# Patient Record
Sex: Male | Born: 1960 | Race: White | Hispanic: No | Marital: Married | Smoking: Current some day smoker
Health system: Southern US, Community
[De-identification: ages and names within clinical notes are randomized; demographics above are authoritative.]

## PROBLEM LIST (undated history)

## (undated) DIAGNOSIS — I10 Essential (primary) hypertension: Secondary | ICD-10-CM

## (undated) DIAGNOSIS — I639 Cerebral infarction, unspecified: Secondary | ICD-10-CM

## (undated) DIAGNOSIS — E119 Type 2 diabetes mellitus without complications: Secondary | ICD-10-CM

## (undated) HISTORY — PX: VASECTOMY: SHX75

## (undated) HISTORY — PX: FINGER SURGERY: SHX640

## (undated) HISTORY — PX: TOE SURGERY: SHX1073

---

## 2004-12-05 ENCOUNTER — Emergency Department (HOSPITAL_COMMUNITY): Admission: EM | Admit: 2004-12-05 | Discharge: 2004-12-05 | Payer: Self-pay | Admitting: Emergency Medicine

## 2017-06-19 ENCOUNTER — Other Ambulatory Visit: Payer: Self-pay

## 2017-06-19 ENCOUNTER — Emergency Department: Payer: Self-pay

## 2017-06-19 ENCOUNTER — Encounter: Payer: Self-pay | Admitting: Emergency Medicine

## 2017-06-19 ENCOUNTER — Observation Stay: Payer: Self-pay

## 2017-06-19 ENCOUNTER — Observation Stay
Admission: EM | Admit: 2017-06-19 | Discharge: 2017-06-20 | Disposition: A | Discharge status: Home / Self Care | Payer: Self-pay | Attending: Internal Medicine | Admitting: Internal Medicine

## 2017-06-19 DIAGNOSIS — Z823 Family history of stroke: Secondary | ICD-10-CM | POA: Insufficient documentation

## 2017-06-19 DIAGNOSIS — Z7982 Long term (current) use of aspirin: Secondary | ICD-10-CM | POA: Insufficient documentation

## 2017-06-19 DIAGNOSIS — I10 Essential (primary) hypertension: Secondary | ICD-10-CM | POA: Insufficient documentation

## 2017-06-19 DIAGNOSIS — E119 Type 2 diabetes mellitus without complications: Secondary | ICD-10-CM | POA: Insufficient documentation

## 2017-06-19 DIAGNOSIS — N179 Acute kidney failure, unspecified: Secondary | ICD-10-CM | POA: Insufficient documentation

## 2017-06-19 DIAGNOSIS — F17290 Nicotine dependence, other tobacco product, uncomplicated: Secondary | ICD-10-CM | POA: Insufficient documentation

## 2017-06-19 DIAGNOSIS — E785 Hyperlipidemia, unspecified: Secondary | ICD-10-CM | POA: Insufficient documentation

## 2017-06-19 DIAGNOSIS — I672 Cerebral atherosclerosis: Secondary | ICD-10-CM | POA: Insufficient documentation

## 2017-06-19 DIAGNOSIS — G459 Transient cerebral ischemic attack, unspecified: Secondary | ICD-10-CM | POA: Diagnosis present

## 2017-06-19 DIAGNOSIS — I639 Cerebral infarction, unspecified: Principal | ICD-10-CM | POA: Insufficient documentation

## 2017-06-19 DIAGNOSIS — Z23 Encounter for immunization: Secondary | ICD-10-CM | POA: Insufficient documentation

## 2017-06-19 DIAGNOSIS — Z8673 Personal history of transient ischemic attack (TIA), and cerebral infarction without residual deficits: Secondary | ICD-10-CM | POA: Insufficient documentation

## 2017-06-19 DIAGNOSIS — Z7984 Long term (current) use of oral hypoglycemic drugs: Secondary | ICD-10-CM | POA: Insufficient documentation

## 2017-06-19 DIAGNOSIS — Z79899 Other long term (current) drug therapy: Secondary | ICD-10-CM | POA: Insufficient documentation

## 2017-06-19 HISTORY — DX: Essential (primary) hypertension: I10

## 2017-06-19 HISTORY — DX: Type 2 diabetes mellitus without complications: E11.9

## 2017-06-19 HISTORY — DX: Cerebral infarction, unspecified: I63.9

## 2017-06-19 LAB — COMPREHENSIVE METABOLIC PANEL
ALBUMIN: 3.7 g/dL (ref 3.5–5.0)
ALT: 30 U/L (ref 17–63)
AST: 30 U/L (ref 15–41)
Alkaline Phosphatase: 70 U/L (ref 38–126)
Anion gap: 13 (ref 5–15)
BUN: 34 mg/dL — ABNORMAL HIGH (ref 6–20)
CHLORIDE: 101 mmol/L (ref 101–111)
CO2: 24 mmol/L (ref 22–32)
CREATININE: 1.91 mg/dL — AB (ref 0.61–1.24)
Calcium: 9.1 mg/dL (ref 8.9–10.3)
GFR calc non Af Amer: 37 mL/min — ABNORMAL LOW (ref >60)
GFR, EST AFRICAN AMERICAN: 43 mL/min — AB (ref >60)
GLUCOSE: 89 mg/dL (ref 65–99)
Potassium: 3.3 mmol/L — ABNORMAL LOW (ref 3.5–5.1)
SODIUM: 138 mmol/L (ref 135–145)
Total Bilirubin: 0.8 mg/dL (ref 0.3–1.2)
Total Protein: 7.1 g/dL (ref 6.5–8.1)

## 2017-06-19 LAB — DIFFERENTIAL
BASOS PCT: 1 %
Basophils Absolute: 0 10*3/uL (ref 0–0.1)
Eosinophils Absolute: 0.2 10*3/uL (ref 0–0.7)
Eosinophils Relative: 2 %
Lymphocytes Relative: 23 %
Lymphs Abs: 2 10*3/uL (ref 1.0–3.6)
MONO ABS: 1 10*3/uL (ref 0.2–1.0)
Monocytes Relative: 11 %
NEUTROS ABS: 5.7 10*3/uL (ref 1.4–6.5)
NEUTROS PCT: 63 %

## 2017-06-19 LAB — CBC
HCT: 43 % (ref 40.0–52.0)
Hemoglobin: 15.1 g/dL (ref 13.0–18.0)
MCH: 31.2 pg (ref 26.0–34.0)
MCHC: 35 g/dL (ref 32.0–36.0)
MCV: 89.1 fL (ref 80.0–100.0)
PLATELETS: 235 10*3/uL (ref 150–440)
RBC: 4.82 MIL/uL (ref 4.40–5.90)
RDW: 13.5 % (ref 11.5–14.5)
WBC: 8.9 10*3/uL (ref 3.8–10.6)

## 2017-06-19 LAB — GLUCOSE, CAPILLARY
GLUCOSE-CAPILLARY: 84 mg/dL (ref 65–99)
Glucose-Capillary: 229 mg/dL — ABNORMAL HIGH (ref 65–99)

## 2017-06-19 LAB — TROPONIN I: Troponin I: <0.03 ng/mL (ref <0.03)

## 2017-06-19 LAB — PROTIME-INR
INR: 0.94
PROTHROMBIN TIME: 12.5 s (ref 11.4–15.2)

## 2017-06-19 LAB — APTT: APTT: 32 s (ref 24–36)

## 2017-06-19 MED ORDER — INSULIN ASPART 100 UNIT/ML ~~LOC~~ SOLN
0.0000 [IU] | Freq: Every day | SUBCUTANEOUS | Status: DC
  Administered 2017-06-19: 21:00:00 2 [IU] via SUBCUTANEOUS
  Filled 2017-06-19: qty 1

## 2017-06-19 MED ORDER — METFORMIN HCL 500 MG PO TABS
1000.0000 mg | ORAL_TABLET | Freq: Two times a day (BID) | ORAL | Status: DC
  Administered 2017-06-20: 09:00:00 1000 mg via ORAL
  Filled 2017-06-19 (×2): qty 2

## 2017-06-19 MED ORDER — ASPIRIN 81 MG PO CHEW
324.0000 mg | CHEWABLE_TABLET | Freq: Once | ORAL | Status: AC
  Administered 2017-06-19: 324 mg via ORAL
  Filled 2017-06-19: qty 4

## 2017-06-19 MED ORDER — STROKE: EARLY STAGES OF RECOVERY BOOK
Freq: Once | Status: AC
  Administered 2017-06-19: 21:00:00

## 2017-06-19 MED ORDER — ATORVASTATIN CALCIUM 20 MG PO TABS: 40.0000 mg | ORAL_TABLET | Freq: Every day | ORAL | Status: DC

## 2017-06-19 MED ORDER — GLIMEPIRIDE 4 MG PO TABS: 4.0000 mg | ORAL_TABLET | Freq: Every day | ORAL | Status: DC

## 2017-06-19 MED ORDER — LATANOPROST 0.005 % OP SOLN
1.0000 [drp] | Freq: Every day | OPHTHALMIC | Status: DC
  Administered 2017-06-19: 21:00:00 1 [drp] via OPHTHALMIC
  Filled 2017-06-19: qty 2.5

## 2017-06-19 MED ORDER — POTASSIUM CHLORIDE CRYS ER 20 MEQ PO TBCR
40.0000 meq | EXTENDED_RELEASE_TABLET | Freq: Once | ORAL | Status: AC
  Administered 2017-06-19: 40 meq via ORAL
  Filled 2017-06-19: qty 2

## 2017-06-19 MED ORDER — INSULIN ASPART 100 UNIT/ML ~~LOC~~ SOLN
0.0000 [IU] | Freq: Three times a day (TID) | SUBCUTANEOUS | Status: DC
  Administered 2017-06-20 (×2): 2 [IU] via SUBCUTANEOUS
  Filled 2017-06-19 (×2): qty 1

## 2017-06-19 MED ORDER — SODIUM CHLORIDE 0.9 % IV SOLN
INTRAVENOUS | Status: DC
  Administered 2017-06-19 – 2017-06-20 (×2): via INTRAVENOUS

## 2017-06-19 MED ORDER — GLIMEPIRIDE 1 MG PO TABS
1.0000 mg | ORAL_TABLET | Freq: Every day | ORAL | Status: DC
  Administered 2017-06-20: 09:00:00 1 mg via ORAL
  Filled 2017-06-19: qty 1

## 2017-06-19 MED ORDER — ASPIRIN EC 81 MG PO TBEC
81.0000 mg | DELAYED_RELEASE_TABLET | Freq: Every day | ORAL | Status: DC
  Administered 2017-06-20: 81 mg via ORAL
  Filled 2017-06-19: qty 1

## 2017-06-19 MED ORDER — INFLUENZA VAC SPLIT QUAD 0.5 ML IM SUSY
0.5000 mL | PREFILLED_SYRINGE | INTRAMUSCULAR | Status: AC
  Administered 2017-06-20: 09:00:00 0.5 mL via INTRAMUSCULAR
  Filled 2017-06-19: qty 0.5

## 2017-06-19 NOTE — ED Notes (Signed)
Telestroke MD ended session, Stated she scored patient a 0 on the NIHSS stroke scale

## 2017-06-19 NOTE — H&P (Signed)
[facility] [place] [facility]   PATIENT NAME: Julian Cameron    MR#:  [id number]  DATE OF BIRTH:  08/05/1960  DATE OF ADMISSION:  06/19/2017  PRIMARY CARE PHYSICIAN: Dr Suzy Boucharduc Tru  REQUESTING/REFERRING PHYSICIAN: Dr Ileana RoupJames McShane  CHIEF COMPLAINT:   Chief Complaint  Patient presents with  . Numbness    HISTORY OF PRESENT ILLNESS:  Julian Cameron  is a 56 y.o. male is here at his son's wedding.  [place].  He has a history of prior stroke 8 years ago hypertension and diabetes.  Today around 230 he got to the wedding reception and his right arm and hand was numb.  His speech would not work correctly.  He was mumbling.  He knew what he wanted to say but just could not say it.  He came here 15 minutes afterwards.  His speech and arm symptoms have normalized since.  He was seen by telemetry neurology and they recommended further testing here in the hospital.  PAST MEDICAL HISTORY:   Past Medical History:  Diagnosis Date  . Diabetes mellitus without complication (HCC)   . Hypertension   . Stroke [facility](HCC)     PAST SURGICAL HISTORY:   Past Surgical History:  Procedure Laterality Date  . FINGER SURGERY    . TOE SURGERY    . VASECTOMY      SOCIAL HISTORY:   Social History   Tobacco Use  . Smoking status: Current Some Day Smoker    Types: Cigars  . Smokeless tobacco: Never Used  Substance Use Topics  . Alcohol use: No    Frequency: Never    FAMILY HISTORY:   Family History  Problem Relation Age of Onset  . CVA Mother   . Bladder Cancer Father     DRUG ALLERGIES:  No Known Allergies  REVIEW OF SYSTEMS:  CONSTITUTIONAL: No fever, fatigue or weakness.  EYES: No blurred or double vision.  Wears glasses. EARS, NOSE, AND THROAT: No tinnitus or ear pain. No sore throat RESPIRATORY: No cough, shortness of breath, wheezing or hemoptysis.  CARDIOVASCULAR: No chest pain, orthopnea, edema.  GASTROINTESTINAL: No nausea, vomiting,  diarrhea or abdominal pain. No blood in bowel movements GENITOURINARY: No dysuria, hematuria.  ENDOCRINE: No polyuria, nocturia,  HEMATOLOGY: No anemia, easy bruising or bleeding SKIN: No rash or lesion. MUSCULOSKELETAL: No joint pain or arthritis.  Left shoulder bursitis NEUROLOGIC: No tingling, numbness, weakness.  PSYCHIATRY: No anxiety or depression.   MEDICATIONS AT HOME:   Prior to Admission medications   Medication Sig Start Date End Date Taking? Authorizing Provider  cloNIDine (CATAPRES) 0.2 MG tablet Take 1 tablet 2 (two) times daily by mouth. 06/17/17   [provider]  glimepiride (AMARYL) 4 MG tablet Take 1 tablet daily by mouth. 03/22/17   [provider]  hydrochlorothiazide (HYDRODIURIL) 25 MG tablet Take 1 tablet daily by mouth. 03/22/17   [provider]  latanoprost (XALATAN) 0.005 % ophthalmic solution Place 1 drop into both eyes. 04/24/17   [provider]  metFORMIN (GLUCOPHAGE) 1000 MG tablet Take 1 tablet 2 (two) times daily by mouth. 03/22/17   [provider]  simvastatin (ZOCOR) 10 MG tablet Take 1 tablet daily by mouth. 03/22/17   [provider]   Medication reconciliation still undergoing  VITAL SIGNS:  Blood pressure 132/79, pulse (!) 54, temperature 97.8 F (36.6 C), resp. rate (!) 21, height 5\' 8"  (1.727 m), weight 97.5 kg (215 lb), SpO2 96 %.  PHYSICAL EXAMINATION:  GENERAL:  56 y.o.-year-old patient lying in the bed with no acute distress.  EYES: Pupils equal, round, reactive to light and accommodation. No scleral icterus. Extraocular muscles intact.  HEENT: Head atraumatic, normocephalic. Oropharynx and nasopharynx clear.  NECK:  Supple, no jugular venous distention. No thyroid enlargement, no tenderness.  LUNGS: Normal breath sounds bilaterally, no wheezing, rales,rhonchi or crepitation. No use of accessory muscles of respiration.  CARDIOVASCULAR: S1, S2 normal. No murmurs, rubs, or gallops.   ABDOMEN: Soft, nontender, nondistended. Bowel sounds present. No organomegaly or mass.  EXTREMITIES: No pedal edema, cyanosis, or clubbing.  NEUROLOGIC: Cranial nerves II through XII are intact. Muscle strength 5/5 in all extremities. Sensation intact to light touch.. Gait not checked.  Babinski negative bilaterally PSYCHIATRIC: The patient is alert and oriented x 3.  SKIN: No rash, lesion, or ulcer.   LABORATORY PANEL:   CBC Recent Labs  Lab 06/19/17 1536  WBC 8.9  HGB 15.1  HCT 43.0  PLT 235   ------------------------------------------------------------------------------------------------------------------  Chemistries  Recent Labs  Lab 06/19/17 1536  NA 138  K 3.3*  CL 101  CO2 24  GLUCOSE 89  BUN 34*  CREATININE 1.91*  CALCIUM 9.1  AST 30  ALT 30  ALKPHOS 70  BILITOT 0.8   ------------------------------------------------------------------------------------------------------------------  Cardiac Enzymes Recent Labs  Lab 06/19/17 1536  TROPONINI <0.03   ------------------------------------------------------------------------------------------------------------------  RADIOLOGY:  Ct Head Code Stroke Wo Contrast  Result Date: 06/19/2017 CLINICAL DATA:  Code stroke. New onset right-sided numbness and slurred speech beginning 30 minutes ago. EXAM: CT HEAD WITHOUT CONTRAST TECHNIQUE: Contiguous axial images were obtained from the base of the skull through the vertex without intravenous contrast. COMPARISON:  None. FINDINGS: Brain: No acute infarct, hemorrhage, or mass lesion is present. The ventricles are of normal size. No significant extraaxial fluid collection is present. No significant white matter disease is present. The brainstem and cerebellum are normal. Vascular: Atherosclerotic calcifications are present within the cavernous internal carotid arteries bilaterally and at the dural margin of both vertebral arteries. There is no hyperdense vessel. Skull: The  calvarium is intact. No focal lytic or blastic lesions are present. Sinuses/Orbits: The paranasal sinuses and mastoid air cells are clear. Globes and orbits are within normal limits. ASPECTS [facility] Stroke Program Early CT Score) - Ganglionic level infarction (caudate, lentiform nuclei, internal capsule, insula, M1-M3 cortex): 7/7 - Supraganglionic infarction (M4-M6 cortex): 3/3 Total score (0-10 with 10 being normal): 10/10 IMPRESSION: 1. Normal CT appearance of the brain. 2. Prominent atherosclerotic calcifications of the cavernous internal carotid arteries and vertebral arteries bilaterally without a hyperdense vessel. This is advanced for age. 3. ASPECTS is 10/10 These results were called by telephone at the time of interpretation on 06/19/2017 at 3:36 pm to Dr. Ileana Roup , who verbally acknowledged these results. Electronically Signed   By: Marin Roberts M.D.   On: 06/19/2017 15:37    EKG:   Sinus rhythm 57 bpm.  IMPRESSION AND PLAN:   1.  Numbness and tingling right arm with slurred speech.  Suspected transient ischemic attack.  Observe overnight on telemetry.  MRI of the brain, carotid ultrasound and echocardiogram ordered.  Aspirin, Lipitor prescribed. 2.  Acute kidney injury versus chronic kidney disease.  Unknown creatinine baseline.  IV fluids overnight and check a creatinine in the morning. 3.  Type 2 diabetes mellitus.  Check a hemoglobin A1c.  Lower the dose of glimepiride to 1 mg daily.  Continue Glucophage.  Sliding scale. 4.  Essential hypertension.  Allow permissive hypertension and hold antihypertensive medications.  If the patient does take clonidine his blood pressure may elevate rather quickly without a dose tonight. 5.  Hyperlipidemia unspecified check a lipid profile in the morning and add Lipitor. 6.  Hypokalemia replace potassium orally  All the records are reviewed and case discussed with ED provider. Management plans discussed with the patient, family and they  are in agreement.  CODE STATUS: Full code but he will speak with his wife about this  TOTAL TIME TAKING CARE OF THIS PATIENT: 50 minutes.    Alford Highlandichard Saajan Willmon M.D on 06/19/2017 at 6:07 PM  Between 7am to 6pm - Pager - [phone]  After 6pm call admission pager [phone]  [facility] Office  [phone]  CC: Primary care physician; Patient, No Pcp Per

## 2017-06-19 NOTE — Progress Notes (Signed)
[facility] responded to code Stemi. Patient was being transported for a CT scan. Family members were present in the room and they were informed about patient status. Family will contact [facility] when they are ready.

## 2017-06-19 NOTE — ED Provider Notes (Addendum)
[facility] Emergency Department Provider Note  ____________________________________________   I have reviewed the triage vital signs and the nursing notes.   HISTORY  Chief Complaint Numbness    HPI Julian Cameron is a 56 y.o. male a history of prior stroke 8 or 9 years ago, history of hypertension, states he did not have residual weakness or numbness from his prior stroke, states that he has been somewhat sleepy "all day" but no focal neurologic deficits.  He states about 3:00 or 2:50 he began having acute difficulty understanding and speaking as well as right-sided tingling.  He was under stress because he was at his son's wedding.  He states his symptoms are rapidly gone away.  At this time he denies any tingling to me and he states that his speech is back to normal.  Family agrees with this.  Symptoms lasted for approximately 20-30 minutes possibly at the most.  Nothing made them better except for time nothing made it worse, no other alleviating or aggravating symptoms no other complaints.  No headache, no chest pain or shortness of breath no nausea no vomiting no fever or chills not drinking alcohol  Past Medical History:  Diagnosis Date  . Hypertension   . Stroke [facility](HCC)     There are no active problems to display for this patient.   Past Surgical History:  Procedure Laterality Date  . FINGER SURGERY    . TOE SURGERY      Prior to Admission medications   Not on File    Allergies Patient has no known allergies.  No family history on file.  Social History Social History   Tobacco Use  . Smoking status: Current Some Day Smoker    Types: Cigars  . Smokeless tobacco: Never Used  Substance Use Topics  . Alcohol use: No    Frequency: Never  . Drug use: No    Review of Systems Constitutional: No fever/chills Eyes: No visual changes. ENT: No sore throat. No stiff neck no neck pain Cardiovascular: Denies chest pain. Respiratory: Denies  shortness of breath. Gastrointestinal:   no vomiting.  No diarrhea.  No constipation. Genitourinary: Negative for dysuria. Musculoskeletal: Negative lower extremity swelling Skin: Negative for rash. Neurological: Negative for severe headaches, f HPI   ____________________________________________   PHYSICAL EXAM:  VITAL SIGNS: ED Triage Vitals [06/19/17 1517]  Enc Vitals Group     BP (!) 143/91     Pulse Rate 61     Resp 16     Temp 97.6 F (36.4 C)     Temp Source Oral     SpO2 99 %     Weight      Height      Head Circumference      Peak Flow      Pain Score      Pain Loc      Pain Edu?      Excl. in GC?     Constitutional: Alert and oriented. Well appearing and in no acute distress. Eyes: Conjunctivae are normal Head: Atraumatic HEENT: No congestion/rhinnorhea. Mucous membranes are moist.  Oropharynx non-erythematous Neck:   Nontender with no meningismus, no masses, no stridor Cardiovascular: Normal rate, regular rhythm. Grossly normal heart sounds.  Good peripheral circulation. Respiratory: Normal respiratory effort.  No retractions. Lungs CTAB. Abdominal: Soft and nontender. No distention. No guarding no rebound Back:  There is no focal tenderness or step off.  there is no midline tenderness there are no lesions noted.  there is no CVA tenderness Musculoskeletal: No lower extremity tenderness, no upper extremity tenderness. No joint effusions, no DVT signs strong distal pulses no edema Neurologic: Cranial nerves II through XII are grossly intact 5 out of 5 strength bilateral upper and lower extremity. Finger to nose within normal limits heel to shin within normal limits, speech is normal with no word finding difficulty or dysarthria, reflexes symmetric, pupils are equally round and reactive to light, there is no pronator drift, sensation is normal, vision is intact to confrontation, gait is deferred, there is no nystagmus, normal neurologic exam Skin:  Skin is warm, dry  and intact. No rash noted. Psychiatric: Mood and affect are anxious. Speech and behavior are normal.  ____________________________________________   LABS (all labs ordered are listed, but only abnormal results are displayed)  Labs Reviewed  PROTIME-INR  APTT  CBC  DIFFERENTIAL  COMPREHENSIVE METABOLIC PANEL  TROPONIN I  CBG MONITORING, ED    Pertinent labs  results that were available during my care of the patient were reviewed by me and considered in my medical decision making (see chart for details). ____________________________________________  EKG  I personally interpreted any EKGs ordered by me or triage Sinus rhythm at 57 bpm, bradycardia noted, no acute ST elevation or depression, normal axis ____________________________________________  RADIOLOGY  Pertinent labs & imaging results that were available during my care of the patient were reviewed by me and considered in my medical decision making (see chart for details). If possible, patient and/or family made aware of any abnormal findings.  No results found. ____________________________________________    PROCEDURES  Procedure(s) performed: None  Procedures  Critical Care performed: None  ____________________________________________   INITIAL IMPRESSION / ASSESSMENT AND PLAN / ED COURSE  Pertinent labs & imaging results that were available during my care of the patient were reviewed by me and considered in my medical decision making (see chart for details).  Patient here with acute onset stroke symptoms, CT is read as negative by radiology, tele-psychiatry has been consulted, patient's blood work is going back blood sugar is normal.  NIH stroke scale is currently 0.  Awaiting neurologic input from neurology  ----------------------------------------- 4:20 PM on 06/19/2017 ----------------------------------------- Neurology agrees with mgt and no tpa.  They want asa and admission for w/u.         ____________________________________________   FINAL CLINICAL IMPRESSION(S) / ED DIAGNOSES  Final diagnoses:  None      This chart was dictated using voice recognition software.  Despite best efforts to proofread,  errors can occur which can change meaning.      Jeanmarie PlantMcShane, Syesha Thaw A, MD 06/19/17 1540    Jeanmarie PlantMcShane, Siniyah Evangelist A, MD 06/19/17 [phone]

## 2017-06-19 NOTE — ED Notes (Signed)
Called dietary and ordered

## 2017-06-19 NOTE — ED Notes (Signed)
Telestroke MD on Robot

## 2017-06-19 NOTE — ED Notes (Signed)
Called report to Andres Shadedra Malcolm RN.

## 2017-06-19 NOTE — ED Notes (Signed)
Patient transported to MRI 

## 2017-06-19 NOTE — ED Notes (Signed)
Telestroke MD notified

## 2017-06-19 NOTE — Consult Note (Signed)
   [facility] TeleNeurology Consult Services  Impression: TIA vs Stroke  Given focal symptoms and short duration, could be either tia or stroke. He currently reports feeling well and close to his baseline.  Less likely related to HTN or recrudescence of prior stroke.   Not a tpa candidate due to: NIHSS 0, rapidly improving symptoms Not an NIR candidate due to: NIHSS 0, rapidly improving symptoms  Comments:   [facility] contacted: 1631 [facility] at bedside: 1641 NIHSS assessment time: 1641  Recommendations:   Antiplatelet therapy with Aspirin 325 mg now Stroke protocol w/u Tele PT OT ST evaluations  dvt ppx Further inpatient evaluation as per Neurology/ Internal Medicine Discussed with ED MD  History of Present Illness   Patient is a 56 yo M with h/o prior stroke (no residual deficits) HTN P/w acute right hand numbness and word finding difficulty The patient had been feeling unwell all day but had gone to a wedding with family .  He noted while traveling to the reception that his right hand became numb/ tingly.  He went to tell his family but found it difficult to describe the situation to them. They did not recognize any speech trouble from him at the time but he subjectively felt trouble finding his words.  This presentation is similar to the presentation he had with his stroke 8 years ago.  HCT is negative. He does not take asa or blood thinners.  Symptoms lasted about 30 min per patient.    Exam  BP 132/79   Pulse (!) 54   Temp 97.8 F (36.6 C)   Resp (!) 21   Ht 5\' 8"  (1.727 m)   Wt 97.5 kg (215 lb)   SpO2 96%   BMI 32.69 kg/m   NIHSS score:0 1A: Level of Consciousness - Alert; keenly responsive 1B: Ask Month and Age - Both Questions Right 1C: 'Blink Eyes' & 'Squeeze Hands' - Performs Both Tasks 2: Test Horizontal Extraocular Movements - Normal 3: Test Visual Fields - No Visual Loss 4: Test Facial Palsy - Normal symmetry 5A: Test Left Arm  Motor Drift - No Drift for 10 Seconds 5B: Test Right Arm Motor Drift - No Drift for 10 Seconds 6A: Test Left Leg Motor Drift - No Drift for 5 Seconds 6B: Test Right Leg Motor Drift - No Drift for 5 Seconds 7: Test Limb Ataxia - No Ataxia 8: Test Sensation - Normal; No sensory loss 9: Test Language/Aphasia - Normal; No aphasia 10: Test Dysarthria - Normal 11: Test Extinction/Inattention - No abnormality  Medical Decision Making:  - Extensive number of diagnosis or management options are considered above.   - Extensive amount of complex data reviewed.   - High risk of complication and/or morbidity or mortality are associated with differential diagnostic considerations above.  - There may be Uncertain outcome and increased probability of prolonged functional impairment or high probability of severe prolonged functional impairment associated with some of these differential diagnosis.  Medical Data Reviewed:  1.Data reviewed include clinical labs, radiology,  Medical Tests;   2.Tests results discussed w/performing or interpreting physician;   3.Obtaining/reviewing old medical records;  4.Obtaining case history from another source;  5.Independent review of image, tracing or specimen.

## 2017-06-19 NOTE — ED Triage Notes (Signed)
Pt states that he was at his sons wedding and developed numbness in the right side and slurred speech. Pt states that numbness has subsided some but is still present, no slurred speech noted at this time. No obvious facial drop noted.

## 2017-06-19 NOTE — ED Notes (Signed)
Stroke robot rolled into room

## 2017-06-19 NOTE — ED Notes (Signed)
Stroke robot activated

## 2017-06-20 ENCOUNTER — Observation Stay
Admit: 2017-06-20 | Discharge: 2017-06-20 | Disposition: A | Discharge status: Home / Self Care | Payer: Self-pay | Attending: Internal Medicine | Admitting: Internal Medicine

## 2017-06-20 ENCOUNTER — Observation Stay: Payer: Self-pay

## 2017-06-20 LAB — GLUCOSE, CAPILLARY
Glucose-Capillary: 180 mg/dL — ABNORMAL HIGH (ref 65–99)
Glucose-Capillary: 195 mg/dL — ABNORMAL HIGH (ref 65–99)

## 2017-06-20 LAB — LIPID PANEL
CHOL/HDL RATIO: 6.7 ratio
Cholesterol: 182 mg/dL (ref 0–200)
HDL: 27 mg/dL — AB (ref >40)
LDL CALC: UNDETERMINED mg/dL (ref 0–99)
TRIGLYCERIDES: 426 mg/dL — AB (ref <150)
VLDL: UNDETERMINED mg/dL (ref 0–40)

## 2017-06-20 LAB — CBC
HEMATOCRIT: 42.8 % (ref 40.0–52.0)
Hemoglobin: 14.9 g/dL (ref 13.0–18.0)
MCH: 31.2 pg (ref 26.0–34.0)
MCHC: 34.7 g/dL (ref 32.0–36.0)
MCV: 89.7 fL (ref 80.0–100.0)
Platelets: 208 10*3/uL (ref 150–440)
RBC: 4.77 MIL/uL (ref 4.40–5.90)
RDW: 13.9 % (ref 11.5–14.5)
WBC: 7.4 10*3/uL (ref 3.8–10.6)

## 2017-06-20 LAB — BASIC METABOLIC PANEL
Anion gap: 8 (ref 5–15)
BUN: 31 mg/dL — ABNORMAL HIGH (ref 6–20)
CO2: 25 mmol/L (ref 22–32)
CREATININE: 1.55 mg/dL — AB (ref 0.61–1.24)
Calcium: 8.8 mg/dL — ABNORMAL LOW (ref 8.9–10.3)
Chloride: 103 mmol/L (ref 101–111)
GFR calc Af Amer: 56 mL/min — ABNORMAL LOW (ref >60)
GFR calc non Af Amer: 48 mL/min — ABNORMAL LOW (ref >60)
GLUCOSE: 220 mg/dL — AB (ref 65–99)
Potassium: 4.1 mmol/L (ref 3.5–5.1)
Sodium: 136 mmol/L (ref 135–145)

## 2017-06-20 LAB — HEMOGLOBIN A1C
Hgb A1c MFr Bld: 9.1 % — ABNORMAL HIGH (ref 4.8–5.6)
Mean Plasma Glucose: 214.47 mg/dL

## 2017-06-20 LAB — ECHOCARDIOGRAM COMPLETE
Height: 68 in
Weight: 3440 oz

## 2017-06-20 MED ORDER — NICOTINE 21 MG/24HR TD PT24: 21.0000 mg | MEDICATED_PATCH | Freq: Every day | TRANSDERMAL | 0 refills | Status: AC

## 2017-06-20 MED ORDER — ATORVASTATIN CALCIUM 40 MG PO TABS: 40.0000 mg | ORAL_TABLET | Freq: Every day | ORAL | 0 refills | Status: AC

## 2017-06-20 MED ORDER — SODIUM CHLORIDE 0.9% FLUSH
3.0000 mL | Freq: Two times a day (BID) | INTRAVENOUS | Status: DC
  Administered 2017-06-20: 09:00:00 3 mL via INTRAVENOUS

## 2017-06-20 MED ORDER — ASPIRIN 81 MG PO TBEC: 81.0000 mg | DELAYED_RELEASE_TABLET | Freq: Every day | ORAL | Status: AC

## 2017-06-20 NOTE — Plan of Care (Signed)
Stroke workup complete with NIH-0.  Reports he has returned to baseline; family at bedside at long intervals. Stroke education complete.

## 2017-06-20 NOTE — ED Notes (Signed)
Patient in CT

## 2017-06-20 NOTE — Discharge Summary (Signed)
[facility] [place] at [facility]    Julian Cameron was admitted to the Hospital on 06/19/2017 and Discharged  06/20/2017 and should be excused from work/school   for 4  days starting 06/19/2017 , may return to work/school without any restrictions.  Call Auburn BilberryShreyang Elfida Shimada MD with questions.  Auburn BilberryPATEL, Yang Rack M.D on 06/20/2017,at 12:16 PM  [facility] [place] [facility]    Office  [phone]

## 2017-06-20 NOTE — Progress Notes (Signed)
Physical Therapy Evaluation Patient Details Name: Julian Cameron MRN: [id number] DOB: [birth year] Today's Date: 06/20/2017   History of Present Illness  Patient is a 56 y.o. male admitted on 17 NOV after noticing R UE numbness and slurred speech at son's wedding reception. PMH includes CVA 8 years ago, HTN, DMII, HLD, and hypokalemia. Found to have two small white matter infarcts in the bifrontal white matter.  Clinical Impression  Patient admitted for above listed reasons. Patient in [place] for son's wedding from [place] with intermittent onset of R UE numbness and slurred speech. At time of PT evaluation, sensation/strength WNL. Patient demonstrates independence in all aspects of mobility and will not require PT f/u upon d/c.    Follow Up Recommendations No PT follow up    Equipment Recommendations  None recommended by PT    Recommendations for Other Services       Precautions / Restrictions Precautions Precautions: None Restrictions Weight Bearing Restrictions: No      Mobility  Bed Mobility Overal bed mobility: Independent             General bed mobility comments: Patient performs bed mobility independently.  Transfers Overall transfer level: Independent Equipment used: None             General transfer comment: Patient moves from sit to stand and stand to sit with good safety awareness.  Ambulation/Gait Ambulation/Gait assistance: Independent Ambulation Distance (Feet): 220 Feet Assistive device: None       General Gait Details: Patient ambulates at safe community gait speed with no notable deviations.  Stairs            Wheelchair Mobility    Modified Rankin (Stroke Patients Only)       Balance Overall balance assessment: Independent                                           Pertinent Vitals/Pain Pain Assessment: No/denies pain    Home Living Family/patient expects to be discharged to:: Private residence Living  Arrangements: Spouse/significant other Available Help at Discharge: Family;Available PRN/intermittently Type of Home: House Home Access: Stairs to enter   Entrance Stairs-Number of Steps: 5 Home Layout: Two level;Able to live on main level with bedroom/bathroom Home Equipment: None      Prior Function Level of Independence: Independent         Comments: Patient from [place], working as a [employer] architect[employer]ruck driver.     Hand Dominance        Extremity/Trunk Assessment   Upper Extremity Assessment Upper Extremity Assessment: Overall WFL for tasks assessed    Lower Extremity Assessment Lower Extremity Assessment: Overall WFL for tasks assessed       Communication   Communication: No difficulties  Cognition Arousal/Alertness: Awake/alert Behavior During Therapy: WFL for tasks assessed/performed Overall Cognitive Status: Within Functional Limits for tasks assessed                                        General Comments      Exercises     Assessment/Plan    PT Assessment Patent does not need any further PT services  PT Problem List         PT Treatment Interventions      PT Goals (Current goals can be found in  the Care Plan section)  Acute Rehab PT Goals Patient Stated Goal: To go home PT Goal Formulation: With patient/family Time For Goal Achievement: 07/04/17 Potential to Achieve Goals: Good    Frequency     Barriers to discharge        Co-evaluation               AM-PAC PT "6 Clicks" Daily Activity  Outcome Measure Difficulty turning over in bed (including adjusting bedclothes, sheets and blankets)?: None Difficulty moving from lying on back to sitting on the side of the bed? : None Difficulty sitting down on and standing up from a chair with arms (e.g., wheelchair, bedside commode, etc,.)?: None Help needed moving to and from a bed to chair (including a wheelchair)?: None Help needed walking in hospital room?: None Help needed climbing  3-5 steps with a railing? : None 6 Click Score: 24    End of Session   Activity Tolerance: Patient tolerated treatment well Patient left: in bed;with call bell/phone within reach;with family/visitor present   PT Visit Diagnosis: Other symptoms and signs involving the nervous system (R29.898)    Time: 1203-1219 PT Time Calculation (min) (ACUTE ONLY): 16 min   Charges:   PT Evaluation $PT Eval Low Complexity: 1 Low     PT G Codes:   PT G-Codes **NOT FOR INPATIENT CLASS** Functional Assessment Tool Used: AM-PAC 6 Clicks Basic Mobility;Clinical judgement Functional Limitation: Mobility: Walking and moving around Mobility: Walking and Moving Around Current Status ([id number]): At least 1 percent but less than 20 percent impaired, limited or restricted Mobility: Walking and Moving Around Goal Status [phone](G8979): At least 1 percent but less than 20 percent impaired, limited or restricted Mobility: Walking and Moving Around Discharge Status [phone](G8980): At least 1 percent but less than 20 percent impaired, limited or restricted      Julian Cameron, PT, DPT, COMT 06/20/2017, 12:48 PM

## 2017-06-20 NOTE — Progress Notes (Signed)
Oral and written AVS instructions given with prescriptions with stated understanding. States ready to go home and feels well/baseline.

## 2017-06-20 NOTE — Progress Notes (Signed)
*  PRELIMINARY RESULTS* Echocardiogram 2D Echocardiogram has been performed.  Julian Cameron 06/20/2017, 9:17 AM

## 2017-06-20 NOTE — ED Notes (Signed)
Stroke team at bedside upon Code Stroke being called

## 2017-06-20 NOTE — Discharge Instructions (Addendum)
Ischemic Stroke An ischemic stroke is the sudden death of brain tissue. Blood carries oxygen to all areas of the body. This type of stroke happens when your blood does not flow to your brain like normal. Your brain cannot get the oxygen it needs. This is an emergency. It must be treated right away. Symptoms of a stroke usually happen all of a sudden. You may notice them when you wake up. They can include:  Weakness or loss of feeling in your face, arm, or leg. This often happens on one side of the body.  Trouble walking.  Trouble moving your arms or legs.  Loss of balance or coordination.  Feeling confused.  Trouble talking or understanding what people are saying.  Slurred speech.  Trouble seeing.  Seeing two of one object (double vision).  Feeling dizzy.  Feeling sick to your stomach (nauseous) and throwing up (vomiting).  A very bad headache for no reason.  Get help as soon as any of these problems start. This is important. Some treatments work better if they are given right away. These include:  Aspirin.  Medicines to control blood pressure.  A shot (injection) of medicine to break up the blood clot.  Treatments given in the blood vessel (artery) to take out the clot or break it up.  Other treatments may include:  Oxygen.  Fluids given through an IV tube.  Medicines to thin out your blood.  Procedures to help your blood flow better.  What increases the risk? Certain things may make you more likely to have a stroke. Some of these are things that you can change, such as:  Being very overweight (obesity).  Smoking.  Taking birth control pills.  Not being active.  Drinking too much alcohol.  Using drugs.  Other risk factors include:  High blood pressure.  High cholesterol.  Diabetes.  Heart disease.  Being [place], Native [address], Hispanic, or [place] Native.  Being over age 57.  Family history of stroke.  Having had blood  clots, stroke, or warning stroke (transient ischemic attack, TIA) in the past.  Sickle cell disease.  Being a woman with a history of high blood pressure in pregnancy (preeclampsia).  Migraine headache.  Sleep apnea.  Having an irregular heartbeat (atrial fibrillation).  Long-term (chronic) diseases that cause soreness and swelling (inflammation).  Disorders that affect how your blood clots.  Follow these instructions at home: Medicines  Take over-the-counter and prescription medicines only as told by your doctor.  If you were told to take aspirin or another medicine to thin your blood, take it exactly as told by your doctor. ? Taking too much of the medicine can cause bleeding. ? If you do not take enough, it may not work as well.  Know the side effects of your medicines. If you are taking a blood thinner, make sure you: ? Hold pressure over any cuts for longer than usual. ? Tell your dentist and other doctors that you take this medicine. ? Avoid activities that may cause damage or injury to your body. Eating and drinking  Follow instructions from your doctor about what you cannot eat or drink.  Eat healthy foods.  If you have trouble with swallowing, do these things to avoid choking: ? Take small bites when eating. ? Eat foods that are soft or pureed. Safety  Follow instructions from your health care team about physical activity.  Use a walker or cane as told by your doctor.  Keep your home safe  so you do not fall. This may include: ? Having experts look at your home to make sure it is safe. ? Putting grab bars in the bedroom and bathroom. ? Using raised toilets. ? Putting a seat in the shower. General instructions  Do not use any tobacco products. ? Examples of these are cigarettes, chewing tobacco, and e-cigarettes. ? If you need help quitting, ask your doctor.  Limit how much alcohol you drink. This means no more than 1 drink a day for nonpregnant women and  2 drinks a day for men. One drink equals 12 oz of beer, 5 oz of wine, or 1 oz of hard liquor.  If you need help to stop using drugs or alcohol, ask your doctor to refer you to a program or specialist.  Stay active. Exercise as told by your doctor.  Keep all follow-up visits as told by your doctor. This is important. Get help right away if:  You suddenly: ? Have weakness or loss of feeling in your face, arm, or leg. ? Feel confused. ? Have trouble talking or understanding what people are saying. ? Have trouble seeing. ? Have trouble walking. ? Have trouble moving your arms or legs. ? Feel dizzy. ? Lose your balance or coordination. ? Have a very bad headache and you do not know why.  You pass out (lose consciousness) or almost pass out.  You have jerky movements that you cannot control (seizure). These symptoms may be an emergency. Do not wait to see if the symptoms will go away. Get medical help right away. Call your local emergency services (911 in the [place] Do not drive yourself to the hospital. This information is not intended to replace advice given to you by your health care provider. Make sure you discuss any questions you have with your health care provider. Document Released: 07/09/2011 Document Revised: 12/31/2015 Document Reviewed: 10/16/2015 Elsevier Interactive Patient Education  2018 [employer] [facility] [place] [facility]  DIET:  Low fat, low cholestrol, diabetic diet  DISCHARGE CONDITION:  Stable  ACTIVITY:  Activity as tolerated  OXYGEN:  Home Oxygen: No.   Oxygen Delivery: room air  DISCHARGE LOCATION:  home   ADDITIONAL DISCHARGE INSTRUCTION:   If you experience worsening of your admission symptoms, develop shortness of breath, life threatening emergency, suicidal or homicidal thoughts you must seek medical attention immediately by calling 911 or calling your MD immediately  if symptoms less severe.  You Must read  complete instructions/literature along with all the possible adverse reactions/side effects for all the Medicines you take and that have been prescribed to you. Take any new Medicines after you have completely understood and accpet all the possible adverse reactions/side effects.   Please note  You were cared for by a hospitalist during your hospital stay. If you have any questions about your discharge medications or the care you received while you were in the hospital after you are discharged, you can call the unit and asked to speak with the hospitalist on call if the hospitalist that took care of you is not available. Once you are discharged, your primary care physician will handle any further medical issues. Please note that NO REFILLS for any discharge medications will be authorized once you are discharged, as it is imperative that you return to your primary care physician (or establish a relationship with a primary care physician if you do not have one) for your aftercare needs so that they can reassess your need  for medications and monitor your lab values.

## 2017-06-20 NOTE — Discharge Summary (Addendum)
[facility] - [place] at [facility], 56 y.o., DOB [birth year], MRN [id number]. Admission date: 06/19/2017 Discharge Date 06/20/2017 Primary MD Patient, No Pcp Per Admitting Physician Alford Highland, MD  Admission Diagnosis  TIA (transient ischemic attack) [G45.9] CVA (cerebral vascular accident) [facility]) [I63.9]  Discharge Diagnosis   Active Problems: Acute CVA Acute kidney injury Hyperlipidemia Essential hypertension Diabetes type 2 Morbid obesity Nicotine abuse        Hospital Course Julian Cameron  is a 56 y.o. male is here at his son's wedding.  He lives in [place].  He has a history of prior stroke 8 years ago hypertension and diabetes.  Today around 230 he got to the wedding reception and his right arm and hand was numb.  His speech would not work correctly.  He was mumbling.  In the emergency room patient CT scan of the head was negative.  His symptoms resolved after 15 minutes in the emergency room.  Patient was admitted for further evaluation.  MRI of the brain did confirm small acute frontal strokes.  He had carotid Dopplers which showed plaques but no significant stenosis.  Echocardiogram showed no evidence of thrombus.  Currently he is asymptomatic.  I reck strongly recommend he start taking one aspirin and stop smoking and lose weight.               Consults  neurology  Significant Tests:  See full reports for all details    [name] Contrast  Result Date: 06/19/2017 CLINICAL DATA:  Stroke follow-up. Right-sided numbness and slurred speech. EXAM: MRI HEAD WITHOUT CONTRAST MRA HEAD WITHOUT CONTRAST TECHNIQUE: Multiplanar, multiecho pulse sequences of the brain and surrounding structures were obtained without intravenous contrast. Angiographic images of the head were obtained using MRA technique without contrast. COMPARISON:  Head CT earlier today FINDINGS: MRI HEAD FINDINGS Brain: Bilateral elongated up to 1 cm acute  infarct in the right posterior frontal and left anterior frontal white matter. These are subtle and difficult to correlate with ADC map, but positive for this magnet. Few FLAIR hyperintensities in the cerebral white matter attributed to chronic small vessel ischemia. No hemorrhage, hydrocephalus, or mass like finding. Normal brain volume. Vascular: Arterial findings below. Normal dural venous sinus flow voids. Skull and upper cervical spine: Negative for marrow lesion. Sinuses/Orbits: Minor opacification of bilateral mastoid air cells and left maxillary sinus. MRA HEAD FINDINGS Undulating lumen of the bilateral cavernous sinus consistent with atherosclerosis. There is mild borderline moderate narrowing at the left paraclinoid segment. Fading signal at the distal M1 and proximal M2 segments. There is a superimposed advanced stenosis of the distal right M1 segment. Duplicated right A2 segment. No branch occlusion or aneurysm is seen. Symmetric vertebral arteries with mild atheromatous type luminal irregularity. There is a high-grade proximal right pica stenosis. Atheromatous irregularity of bilateral PCAs without proximal flow limiting stenosis. Negative for aneurysm. IMPRESSION: 1. Two small white matter infarcts in the bifrontal white matter that could be acute or subacute. 2. Intracranial atherosclerosis with high-grade right M1 segment stenosis. High-grade right pica stenosis. 3. Mild chronic microvascular ischemic change in the cerebral white matter. Electronically Signed   By: Marnee Spring M.D.   On: 06/19/2017 19:38   US Carotid Bilateral (at [facility] Only)  Result Date: 06/20/2017 CLINICAL DATA:  Stroke. Hypertension, hyperlipidemia, diabetes, previous tobacco abuse. EXAM: BILATERAL CAROTID DUPLEX ULTRASOUND TECHNIQUE: Wallace Cullens scale imaging, color Doppler and duplex ultrasound was performed of bilateral carotid and vertebral  arteries in the neck. COMPARISON:  None. TECHNIQUE: Quantification of carotid  stenosis is based on velocity parameters that correlate the residual internal carotid diameter with NASCET-based stenosis levels, using the diameter of the distal internal carotid lumen as the denominator for stenosis measurement. The following velocity measurements were obtained: PEAK SYSTOLIC/END DIASTOLIC RIGHT ICA:                     86/23cm/sec CCA:                     96/28cm/sec SYSTOLIC ICA/CCA RATIO:  0.9 DIASTOLIC ICA/CCA RATIO: 0.8 ECA:                     81cm/sec LEFT ICA:                     89/30cm/sec CCA:                     111/27cm/sec SYSTOLIC ICA/CCA RATIO:  0.8 DIASTOLIC ICA/CCA RATIO: 1.1 ECA:                     84cm/sec FINDINGS: RIGHT CAROTID ARTERY: Eccentric partially calcified nonocclusive plaque in the proximal ICA. Normal waveforms and color Doppler signal. RIGHT VERTEBRAL ARTERY:  Normal flow direction and waveform. LEFT CAROTID ARTERY: Eccentric noncalcified plaque in the mid common carotid artery without high-grade stenosis. Eccentric partially calcified plaque in the carotid bulb and proximal ICA. No high-grade stenosis. Normal waveforms and color Doppler signal. LEFT VERTEBRAL ARTERY: Normal flow direction and waveform. IMPRESSION: 1. Bilateral carotid bifurcation plaque resulting in less than 50% diameter stenosis. 2.  Antegrade bilateral vertebral arterial flow. Electronically Signed   By: Corlis Leak M.D.   On: 06/20/2017 10:06   Mr Julian Cameron Head Wo Contrast  Result Date: 06/19/2017 CLINICAL DATA:  Stroke follow-up. Right-sided numbness and slurred speech. EXAM: MRI HEAD WITHOUT CONTRAST MRA HEAD WITHOUT CONTRAST TECHNIQUE: Multiplanar, multiecho pulse sequences of the brain and surrounding structures were obtained without intravenous contrast. Angiographic images of the head were obtained using MRA technique without contrast. COMPARISON:  Head CT earlier today FINDINGS: MRI HEAD FINDINGS Brain: Bilateral elongated up to 1 cm acute infarct in the right posterior frontal and  left anterior frontal white matter. These are subtle and difficult to correlate with ADC map, but positive for this magnet. Few FLAIR hyperintensities in the cerebral white matter attributed to chronic small vessel ischemia. No hemorrhage, hydrocephalus, or mass like finding. Normal brain volume. Vascular: Arterial findings below. Normal dural venous sinus flow voids. Skull and upper cervical spine: Negative for marrow lesion. Sinuses/Orbits: Minor opacification of bilateral mastoid air cells and left maxillary sinus. MRA HEAD FINDINGS Undulating lumen of the bilateral cavernous sinus consistent with atherosclerosis. There is mild borderline moderate narrowing at the left paraclinoid segment. Fading signal at the distal M1 and proximal M2 segments. There is a superimposed advanced stenosis of the distal right M1 segment. Duplicated right A2 segment. No branch occlusion or aneurysm is seen. Symmetric vertebral arteries with mild atheromatous type luminal irregularity. There is a high-grade proximal right pica stenosis. Atheromatous irregularity of bilateral PCAs without proximal flow limiting stenosis. Negative for aneurysm. IMPRESSION: 1. Two small white matter infarcts in the bifrontal white matter that could be acute or subacute. 2. Intracranial atherosclerosis with high-grade right M1 segment stenosis. High-grade right pica stenosis. 3. Mild chronic microvascular ischemic change in the cerebral white matter. Electronically Signed  By: Marnee SpringJonathon  Watts M.D.   On: 06/19/2017 19:38   Ct Head Code Stroke Wo Contrast  Result Date: 06/19/2017 CLINICAL DATA:  Code stroke. New onset right-sided numbness and slurred speech beginning 30 minutes ago. EXAM: CT HEAD WITHOUT CONTRAST TECHNIQUE: Contiguous axial images were obtained from the base of the skull through the vertex without intravenous contrast. COMPARISON:  None. FINDINGS: Brain: No acute infarct, hemorrhage, or mass lesion is present. The ventricles are of  normal size. No significant extraaxial fluid collection is present. No significant white matter disease is present. The brainstem and cerebellum are normal. Vascular: Atherosclerotic calcifications are present within the cavernous internal carotid arteries bilaterally and at the dural margin of both vertebral arteries. There is no hyperdense vessel. Skull: The calvarium is intact. No focal lytic or blastic lesions are present. Sinuses/Orbits: The paranasal sinuses and mastoid air cells are clear. Globes and orbits are within normal limits. ASPECTS [facility]([place] Stroke Program Early CT Score) - Ganglionic level infarction (caudate, lentiform nuclei, internal capsule, insula, M1-M3 cortex): 7/7 - Supraganglionic infarction (M4-M6 cortex): 3/3 Total score (0-10 with 10 being normal): 10/10 IMPRESSION: 1. Normal CT appearance of the brain. 2. Prominent atherosclerotic calcifications of the cavernous internal carotid arteries and vertebral arteries bilaterally without a hyperdense vessel. This is advanced for age. 3. ASPECTS is 10/10 These results were called by telephone at the time of interpretation on 06/19/2017 at 3:36 pm to Dr. Ileana RoupJAMES MCSHANE , who verbally acknowledged these results. Electronically Signed   By: Marin Robertshristopher  Mattern M.D.   On: 06/19/2017 15:37       Today   Subjective:   Geanie LoganFred Corwin  patient doing well currently has no symptoms  Objective:   Blood pressure (!) 169/79, pulse 63, temperature 97.6 F (36.4 C), temperature source Oral, resp. rate 18, height 5\' 8"  (1.727 m), weight 215 lb (97.5 kg), SpO2 98 %.  .  Intake/Output Summary (Last 24 hours) at 06/20/2017 1426 Last data filed at 06/20/2017 1408 Gross per 24 hour  Intake 1092.5 ml  Output -  Net 1092.5 ml    Exam VITAL SIGNS: Blood pressure (!) 169/79, pulse 63, temperature 97.6 F (36.4 C), temperature source Oral, resp. rate 18, height 5\' 8"  (1.727 m), weight 215 lb (97.5 kg), SpO2 98 %.  GENERAL:  56 y.o.-year-old  patient lying in the bed with no acute distress.  EYES: Pupils equal, round, reactive to light and accommodation. No scleral icterus. Extraocular muscles intact.  HEENT: Head atraumatic, normocephalic. Oropharynx and nasopharynx clear.  NECK:  Supple, no jugular venous distention. No thyroid enlargement, no tenderness.  LUNGS: Normal breath sounds bilaterally, no wheezing, rales,rhonchi or crepitation. No use of accessory muscles of respiration.  CARDIOVASCULAR: S1, S2 normal. No murmurs, rubs, or gallops.  ABDOMEN: Soft, nontender, nondistended. Bowel sounds present. No organomegaly or mass.  EXTREMITIES: No pedal edema, cyanosis, or clubbing.  NEUROLOGIC: Cranial nerves II through XII are intact. Muscle strength 5/5 in all extremities. Sensation intact. Gait not checked.  PSYCHIATRIC: The patient is alert and oriented x 3.  SKIN: No obvious rash, lesion, or ulcer.   Data Review     CBC w Diff:  Lab Results  Component Value Date   WBC 7.4 06/20/2017   HGB 14.9 06/20/2017   HCT 42.8 06/20/2017   PLT 208 06/20/2017   LYMPHOPCT 23 06/19/2017   MONOPCT 11 06/19/2017   EOSPCT 2 06/19/2017   BASOPCT 1 06/19/2017   CMP:  Lab Results  Component Value Date  NA 136 06/20/2017   K 4.1 06/20/2017   CL 103 06/20/2017   CO2 25 06/20/2017   BUN 31 (H) 06/20/2017   CREATININE 1.55 (H) 06/20/2017   PROT 7.1 06/19/2017   ALBUMIN 3.7 06/19/2017   BILITOT 0.8 06/19/2017   ALKPHOS 70 06/19/2017   AST 30 06/19/2017   ALT 30 06/19/2017  .  Micro Results No results found for this or any previous visit (from the past 240 hour(s)).   Code Status History    This patient does not have a recorded code status. Please follow your organizational policy for patients in this situation.          Follow-up Information    pcp Follow up in 1 week(s).   Why:  hosp f/u          Discharge Medications   Allergies as of 06/20/2017   No Known Allergies     Medication List    TAKE  these medications   aspirin 81 MG EC tablet Take 1 tablet (81 mg total) daily by mouth. Start taking on:  06/21/2017   atorvastatin 40 MG tablet Commonly known as:  LIPITOR Take 1 tablet (40 mg total) daily at 6 PM by mouth.   cimetidine 200 MG tablet Commonly known as:  TAGAMET Take 200 mg 2 (two) times daily by mouth.   cloNIDine 0.2 MG tablet Commonly known as:  CATAPRES Take 1 tablet 2 (two) times daily by mouth.   glimepiride 4 MG tablet Commonly known as:  AMARYL Take 1 tablet daily by mouth.   hydrochlorothiazide 25 MG tablet Commonly known as:  HYDRODIURIL Take 1 tablet daily by mouth.   latanoprost 0.005 % ophthalmic solution Commonly known as:  XALATAN Place 1 drop into both eyes.   lisinopril 40 MG tablet Commonly known as:  PRINIVIL,ZESTRIL Take 40 mg daily by mouth.   metFORMIN 1000 MG tablet Commonly known as:  GLUCOPHAGE Take 1 tablet 2 (two) times daily by mouth.   metoprolol tartrate 100 MG tablet Commonly known as:  LOPRESSOR Take 100 mg 2 (two) times daily by mouth.   nebivolol 10 MG tablet Commonly known as:  BYSTOLIC Take 10 mg daily by mouth.   nicotine 21 mg/24hr patch Commonly known as:  NICODERM CQ Place 1 patch (21 mg total) daily onto the skin.   SUDAFED PE PRESSURE+PAIN+MUCUS 5-325-200 MG Tabs Generic drug:  Phenylephrine-APAP-Guaifenesin Take 1 tablet every 6 (six) hours by mouth.          Total Time in preparing paper work, data evaluation and todays exam - 35 minutes  Auburn BilberryPATEL, Shawntina Diffee M.D on 06/20/2017 at 2:26 PM  [facility]   Office  [phone]

## 2017-06-20 NOTE — Progress Notes (Signed)
Nicotine abuse Patient states that he smokes cigars on a daily basis with his acute stroke I strongly recommended he stop smoking, I recommended nicotine patch I have provided him prescription 4 minutes spent on counseling regarding smoking.

## 2017-06-20 NOTE — ED Notes (Signed)
Patient returned from CT

## 2018-03-21 IMAGING — MR MR HEAD W/O CM
11 of 13 series · 31 of 48 positions shown · non-contrast
Comparison: Head CT earlier today

CLINICAL DATA: Stroke follow-up. Right-sided numbness and slurred
speech.

EXAM:
MRI HEAD WITHOUT CONTRAST
MRA HEAD WITHOUT CONTRAST
TECHNIQUE: Multiplanar, multiecho pulse sequences of the brain and surrounding
structures were obtained without intravenous contrast. Angiographic
images of the head were obtained using MRA technique without
contrast.

[Series 2: T1 · sagittal · 5.0mm · 0.45mm/px · 2 of 28 slices shown]
[im 1/28]
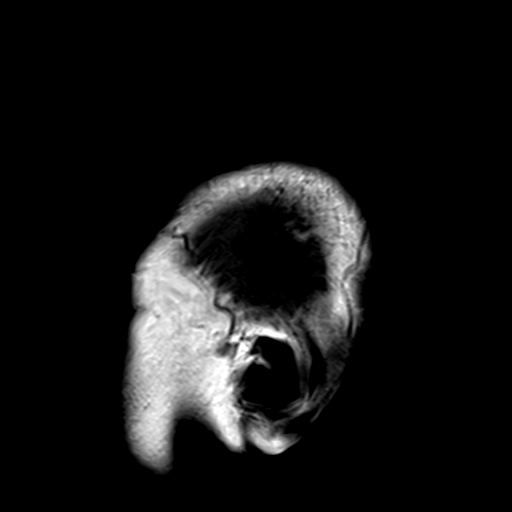
[im 28/28]
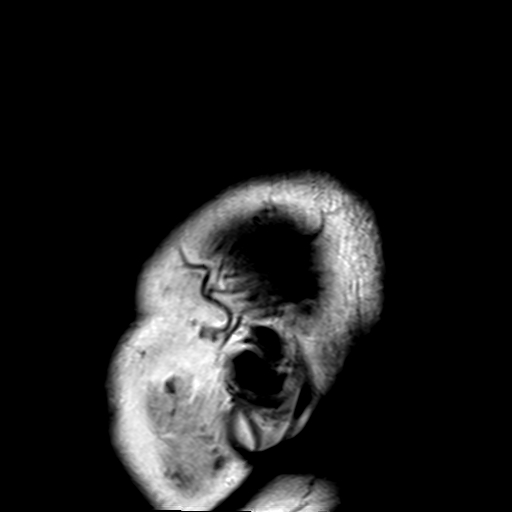

[Series 4: DWI · axial · 3.0mm · 1.80mm/px · z∈[-107,+54]mm · 4 of 50 slices shown (1 of 2)]
[im 1/50]
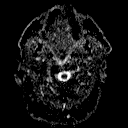
[im 17/50]
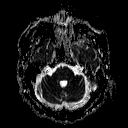
[im 33/50]
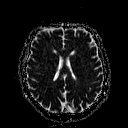
[im 50/50]
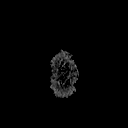

[Series 6: DWI · coronal · 3.0mm · 1.80mm/px · 4 of 44 slices shown (2 of 2)]
[im 1/44]
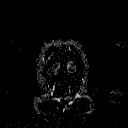
[im 15/44]
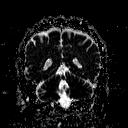
[im 29/44]
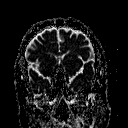
[im 44/44]
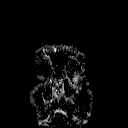

[Series 7: GRE · sagittal · 5.0mm · 0.45mm/px · 2 of 25 slices shown (1 of 2)]
[im 1/25]
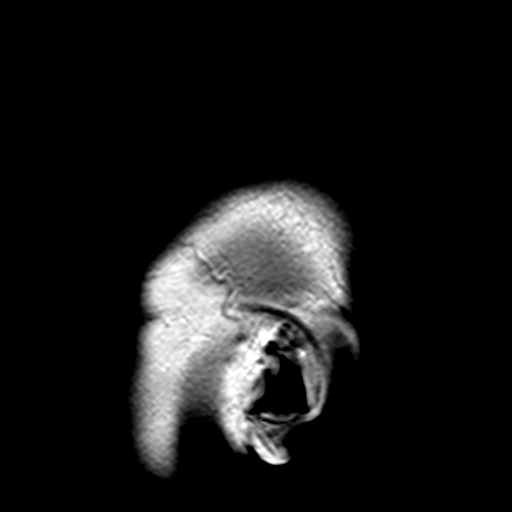
[im 25/25]
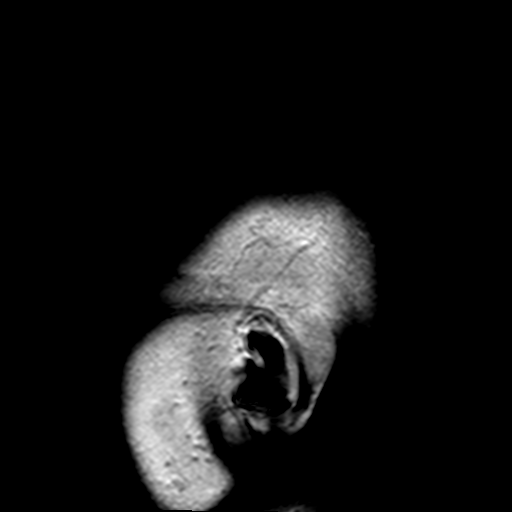

[Series 8: T2 · axial · 5.0mm · 0.45mm/px · z∈[-69,+72]mm · 2 of 23 slices shown (1 of 4)]
[im 1/23]
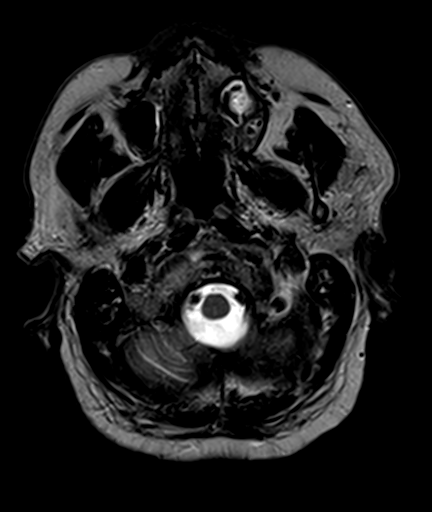
[im 23/23]
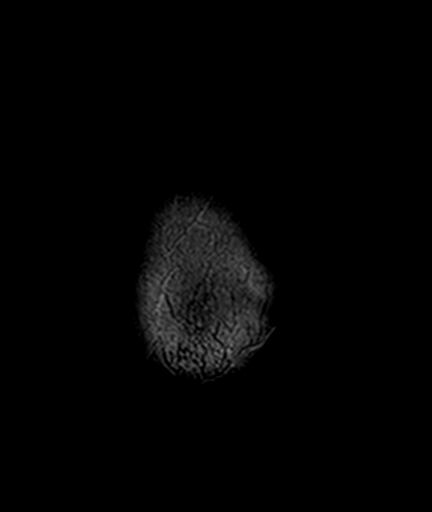

[Series 9: FLAIR · axial · 3.0mm · 0.45mm/px · z∈[-86,+69]mm · 4 of 53 slices shown]
[im 1/53]
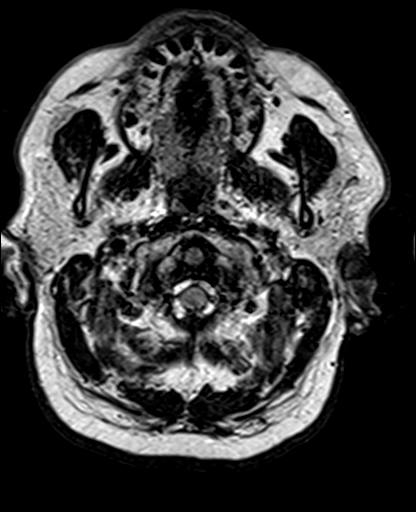
[im 18/53]
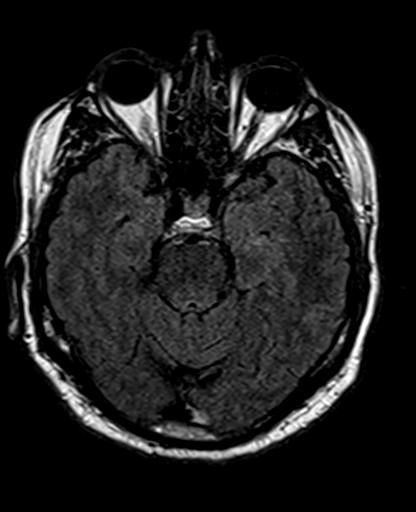
[im 35/53]
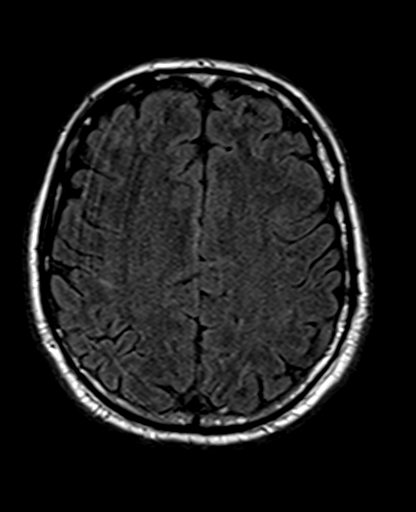
[im 53/53]
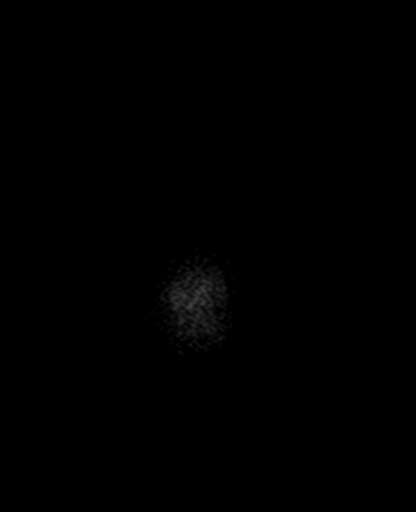

[Series 10: T2 · axial · 3.0mm · 1.20mm/px · z∈[-88,+67]mm · 4 of 53 slices shown (2 of 4)]
[im 1/53]
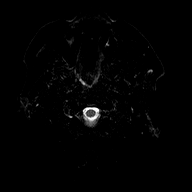
[im 18/53]
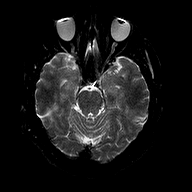
[im 35/53]
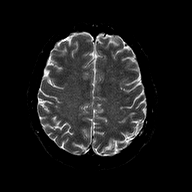
[im 53/53]
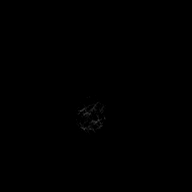

[Series 11: GRE · axial · 5.0mm · 0.45mm/px · z∈[-76,+79]mm · 2 of 25 slices shown (2 of 2)]
[im 1/25]
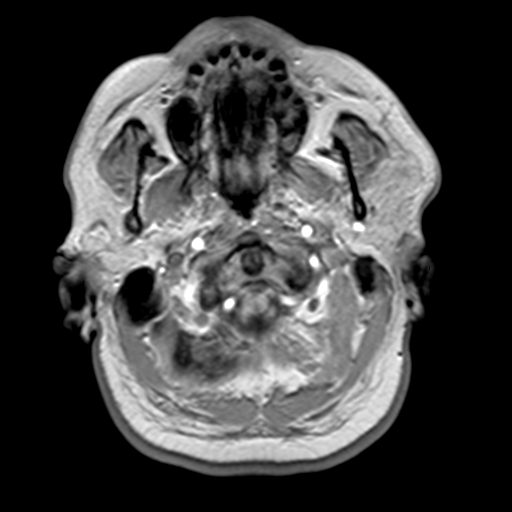
[im 25/25]
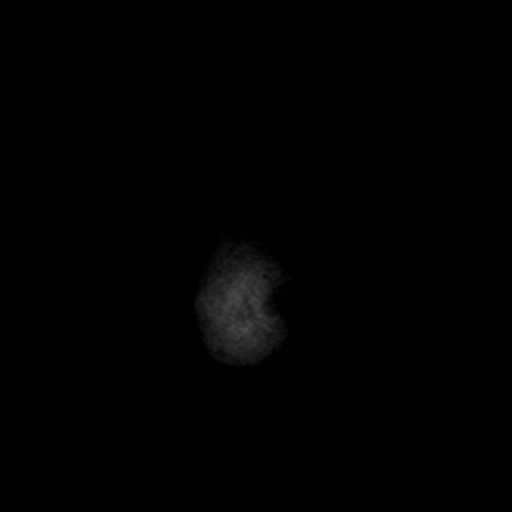

[Series 12: T2 · coronal · 5.0mm · 0.45mm/px · 2 of 24 slices shown (3 of 4)]
[im 1/24]
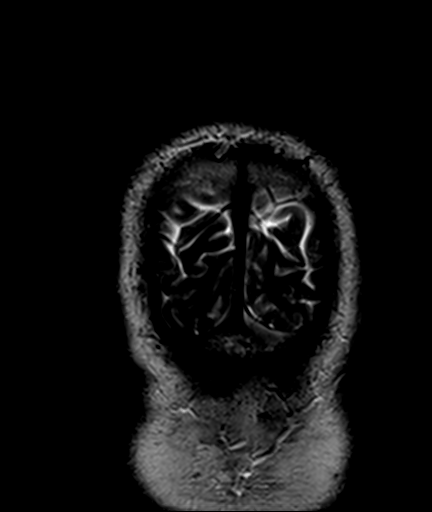
[im 24/24]
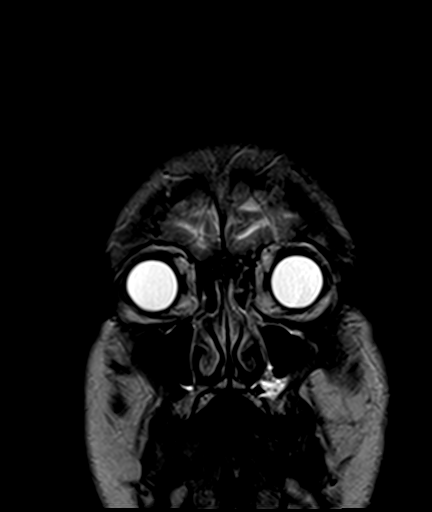

[Series 13: tof_3d_multi-slab · axial · 0.7mm · 0.37mm/px · z∈[-64,-38]mm · 3 of 143 slices shown]
[im 1/143]
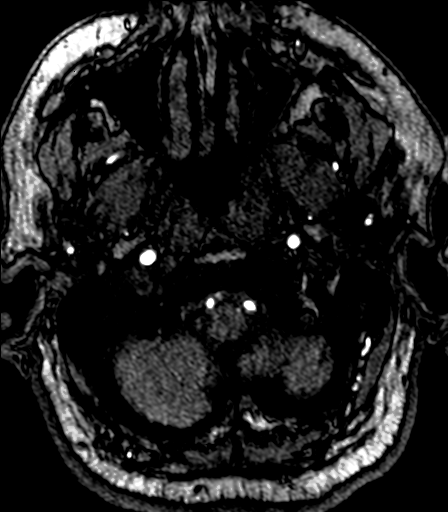
[im 26/143]
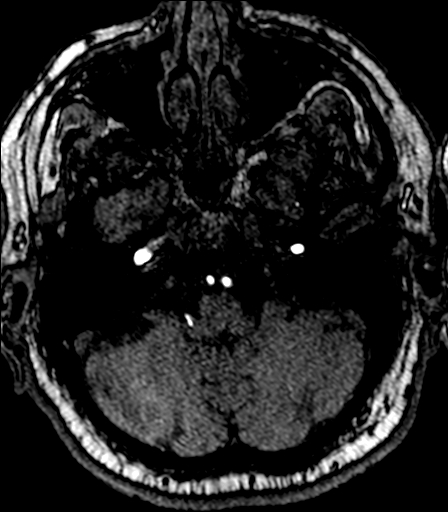
[im 39/143]
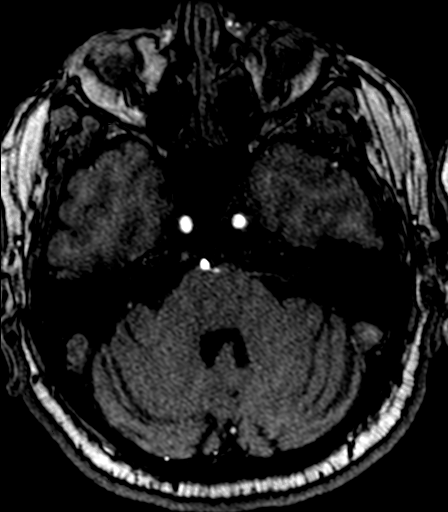

[Series 17: T2 · coronal · 5.0mm · 0.45mm/px · 2 of 24 slices shown (4 of 4)]
[im 1/24]
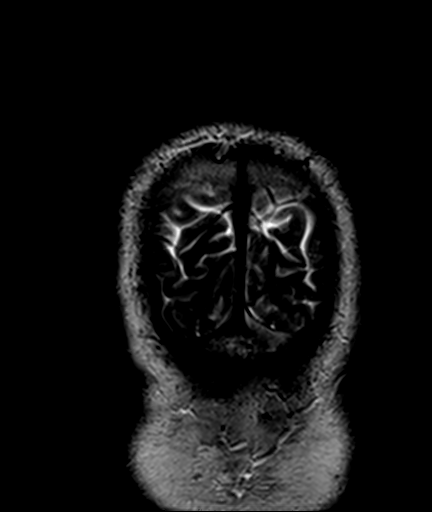
[im 24/24]
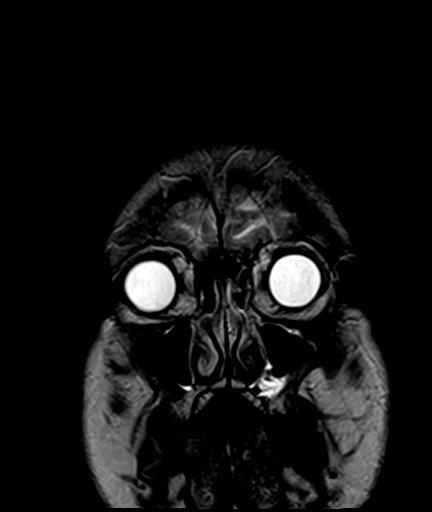

[31 of 48 positions shown; findings below may reference images not displayed]

FINDINGS: MRI HEAD FINDINGS

Brain: Bilateral elongated up to 1 cm acute infarct in the right
posterior frontal and left anterior frontal white matter. These are
subtle and difficult to correlate with ADC map, but positive for
this magnet. Few FLAIR hyperintensities in the cerebral white matter
attributed to chronic small vessel ischemia. No hemorrhage,
hydrocephalus, or mass like finding. Normal brain volume.

Vascular: Arterial findings below. Normal dural venous sinus flow
voids.

Skull and upper cervical spine: Negative for marrow lesion.

Sinuses/Orbits: Minor opacification of bilateral mastoid air cells
and left maxillary sinus.

MRA HEAD FINDINGS

Undulating lumen of the bilateral cavernous sinus consistent with
atherosclerosis. There is mild borderline moderate narrowing at the
left paraclinoid segment. Fading signal at the distal M1 and
proximal M2 segments. There is a superimposed advanced stenosis of
the distal right M1 segment. Duplicated right A2 segment. No branch
occlusion or aneurysm is seen.

Symmetric vertebral arteries with mild atheromatous type luminal
irregularity. There is a high-grade proximal right pica stenosis.
Atheromatous irregularity of bilateral PCAs without proximal flow
limiting stenosis. Negative for aneurysm.
IMPRESSION: 1. Two small white matter infarcts in the bifrontal white matter
that could be acute or subacute.
2. Intracranial atherosclerosis with high-grade right M1 segment
stenosis. High-grade right pica stenosis.
3. Mild chronic microvascular ischemic change in the cerebral white
matter.
